# Patient Record
Sex: Male | Born: 2000 | Race: White | Hispanic: No | Marital: Single | Smoking: Never smoker
Health system: Southern US, Community
[De-identification: ages and names within clinical notes are randomized; demographics above are authoritative.]

## PROBLEM LIST (undated history)

## (undated) DIAGNOSIS — T7840XA Allergy, unspecified, initial encounter: Secondary | ICD-10-CM

## (undated) DIAGNOSIS — L309 Dermatitis, unspecified: Secondary | ICD-10-CM

## (undated) HISTORY — DX: Dermatitis, unspecified: L30.9

## (undated) HISTORY — DX: Allergy, unspecified, initial encounter: T78.40XA

---
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
## 2022-01-08 ENCOUNTER — Emergency Department (HOSPITAL_BASED_OUTPATIENT_CLINIC_OR_DEPARTMENT_OTHER): Payer: 59

## 2022-01-08 ENCOUNTER — Other Ambulatory Visit: Payer: Self-pay

## 2022-01-08 ENCOUNTER — Other Ambulatory Visit (HOSPITAL_BASED_OUTPATIENT_CLINIC_OR_DEPARTMENT_OTHER): Payer: Self-pay

## 2022-01-08 ENCOUNTER — Emergency Department (HOSPITAL_BASED_OUTPATIENT_CLINIC_OR_DEPARTMENT_OTHER)
Admission: EM | Admit: 2022-01-08 | Discharge: 2022-01-08 | Disposition: A | Discharge status: Home / Self Care | Payer: 59 | Attending: Emergency Medicine | Admitting: Emergency Medicine

## 2022-01-08 DIAGNOSIS — J188 Other pneumonia, unspecified organism: Secondary | ICD-10-CM | POA: Diagnosis not present

## 2022-01-08 DIAGNOSIS — Z20822 Contact with and (suspected) exposure to covid-19: Secondary | ICD-10-CM | POA: Diagnosis not present

## 2022-01-08 DIAGNOSIS — R059 Cough, unspecified: Secondary | ICD-10-CM | POA: Diagnosis present

## 2022-01-08 DIAGNOSIS — J189 Pneumonia, unspecified organism: Secondary | ICD-10-CM

## 2022-01-08 LAB — SARS CORONAVIRUS 2 BY RT PCR: SARS Coronavirus 2 by RT PCR: NEGATIVE

## 2022-01-08 MED ORDER — ACETAMINOPHEN 500 MG PO TABS
1000.0000 mg | ORAL_TABLET | Freq: Once | ORAL | Status: AC
  Administered 2022-01-08: 1000 mg via ORAL
  Filled 2022-01-08: qty 2

## 2022-01-08 MED ORDER — AZITHROMYCIN 250 MG PO TABS
250.0000 mg | ORAL_TABLET | Freq: Every day | ORAL | 0 refills | Status: AC
  Filled 2022-01-08: qty 6, 5d supply, fill #0

## 2022-01-08 NOTE — ED Triage Notes (Signed)
Pt states he had a fever of 103 yesterday, and a cough for the past few days.

## 2022-01-08 NOTE — Discharge Instructions (Signed)
If you develop high fever, severe cough or cough with blood, trouble breathing, severe headache, neck pain/stiffness, vomiting, or any other new/concerning symptoms then return to the ER for evaluation  

## 2022-01-08 NOTE — ED Provider Notes (Signed)
[facility] EMERGENCY DEPARTMENT Provider Note   CSN: [id number] Arrival date & time: 01/08/22  0710     History  Chief Complaint  Patient presents with   Cough    Henry Mcdonald is a 21 y.o. male.  HPI 21 year old male with no significant past medical history presents with cough and fever.  He states he started off having a cold about 10 days ago.  Since 9/7 has been dealing with a productive cough and fever up to 103.  He is felt a little short of breath and has had some pain in his chest when he breathes since yesterday.  He has felt nauseated and has vomited a couple times after coughing real hard.  He states he is drinking plenty of fluids.  No other pain or abdominal pain.  Home Medications Prior to Admission medications   Medication Sig Start Date End Date Taking? Authorizing Provider  azithromycin (ZITHROMAX) 250 MG tablet Take 1 tablet (250 mg total) by mouth daily. Take first 2 tablets together, then 1 every day until finished. 01/08/22  Yes Pricilla Loveless, MD  desonide (DESOWEN) 0.05 % ointment Apply thin layer to affected area 1-2 times per day for the next 3-5 days. Patient not taking: Reported on 10/21/2016 01/02/13   Meryl Dare, NP  ibuprofen (ADVIL,MOTRIN) 600 MG tablet Take 1 tablet (600 mg total) by mouth every 6 (six) hours as needed. 10/21/16   Adibe, Felix Pacini, MD  Ivermectin 0.5 % LOTN Apply 1 application topically once. Patient not taking: Reported on 10/21/2016 02/02/15   Gretchen Short, NP  oxyCODONE (ROXICODONE) 5 MG immediate release tablet Take 1 tablet (5 mg total) by mouth every 4 (four) hours as needed for severe pain. 10/21/16   Adibe, Felix Pacini, MD      Allergies    Patient has no known allergies.    Review of Systems   Review of Systems  Constitutional:  Positive for fever.  Respiratory:  Positive for cough and shortness of breath.   Cardiovascular:  Positive for chest pain.  Gastrointestinal:  Positive for vomiting (post tussive).  Negative for abdominal pain.    Physical Exam Updated Vital Signs BP 109/72   Pulse 85   Temp 99.5 F (37.5 C) (Oral)   Resp (!) 22   Ht 6\' 5"  (1.956 m)   Wt 86.2 kg   SpO2 97%   BMI 22.53 kg/m  Physical Exam Vitals and nursing note reviewed.  Constitutional:      General: He is not in acute distress.    Appearance: He is well-developed. He is not ill-appearing or diaphoretic.  HENT:     Head: Normocephalic and atraumatic.  Cardiovascular:     Rate and Rhythm: Normal rate and regular rhythm.     Heart sounds: Normal heart sounds.  Pulmonary:     Effort: Pulmonary effort is normal.     Breath sounds: Normal breath sounds. No wheezing or rales.  Chest:     Chest wall: Tenderness (mild, inferior sternum) present.  Abdominal:     Palpations: Abdomen is soft.     Tenderness: There is no abdominal tenderness.  Skin:    General: Skin is warm and dry.  Neurological:     Mental Status: He is alert.     ED Results / Procedures / Treatments   Labs (all labs ordered are listed, but only abnormal results are displayed) Labs Reviewed  SARS CORONAVIRUS 2 BY RT PCR    EKG EKG Interpretation  Date/Time:  Monday January 08 2022 07:54:24 EDT Ventricular Rate:  88 PR Interval:  168 QRS Duration: 90 QT Interval:  347 QTC Calculation: 420 R Axis:   104 Text Interpretation: Sinus rhythm Borderline right axis deviation ST elev, probable normal early repol pattern Baseline wander in lead(s) II III aVF No old tracing to compare Confirmed by Pricilla Loveless [phone]) on 01/08/2022 9:08:08 AM  Radiology DG Chest 2 View  Result Date: 01/08/2022 CLINICAL DATA:  Cough, fever EXAM: CHEST - 2 VIEW COMPARISON:  None Available. FINDINGS: The heart size and mediastinal contours are within normal limits. Both lungs are clear. The visualized skeletal structures are unremarkable. IMPRESSION: No active cardiopulmonary disease. Electronically Signed   By: Ernie Avena M.D.   On: 01/08/2022  08:00    Procedures Procedures    Medications Ordered in ED Medications  acetaminophen (TYLENOL) tablet 1,000 mg (1,000 mg Oral Given 01/08/22 0746)    ED Course/ Medical Decision Making/ A&P                           Medical Decision Making Amount and/or Complexity of Data Reviewed Radiology: ordered.  Risk OTC drugs. Prescription drug management.   Patient is overall well-appearing.  His vital signs are overall unremarkable.  While he does have a little bit of pleuritic chest pain is also reproducible and in the setting of cough and fever and I highly doubt something such as PE.  His chest x-ray images were viewed and interpreted by myself and I see no obvious pneumonia.  COVID test is negative.  With his recent URI and now developing fever and worse respiratory symptoms I suspect he does have pneumonia and we will treat him with azithromycin for atypical pneumonia.  He otherwise is young and healthy and I think is stable for discharge home.  However we discussed return precautions.        Final Clinical Impression(s) / ED Diagnoses Final diagnoses:  Atypical pneumonia    Rx / DC Orders ED Discharge Orders          Ordered    azithromycin (ZITHROMAX) 250 MG tablet  Daily        01/08/22 0906              Pricilla Loveless, MD 01/08/22 [phone]

## 2024-01-27 ENCOUNTER — Other Ambulatory Visit (HOSPITAL_COMMUNITY): Payer: Self-pay
